# Patient Record
Sex: Male | Born: 1995 | Race: White | Hispanic: No | Marital: Single | Smoking: Current some day smoker
Health system: Southern US, Community
[De-identification: ages and names within clinical notes are randomized; demographics above are authoritative.]

## PROBLEM LIST (undated history)

## (undated) DIAGNOSIS — F988 Other specified behavioral and emotional disorders with onset usually occurring in childhood and adolescence: Secondary | ICD-10-CM

## (undated) HISTORY — PX: DENTAL SURGERY: SHX609

---
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
## 2016-12-01 IMAGING — CR DG WRIST COMPLETE 3+V*L*
3 series · 3 of 3 positions shown · non-contrast
Comparison: Left wrist radiographs performed earlier today at [DATE]
p.m.

CLINICAL DATA: Status post reduction of left wrist fracture. Status
post skateboarding accident. Initial encounter.

EXAM:
LEFT WRIST - COMPLETE 3+ VIEW

[x wrist pa left]
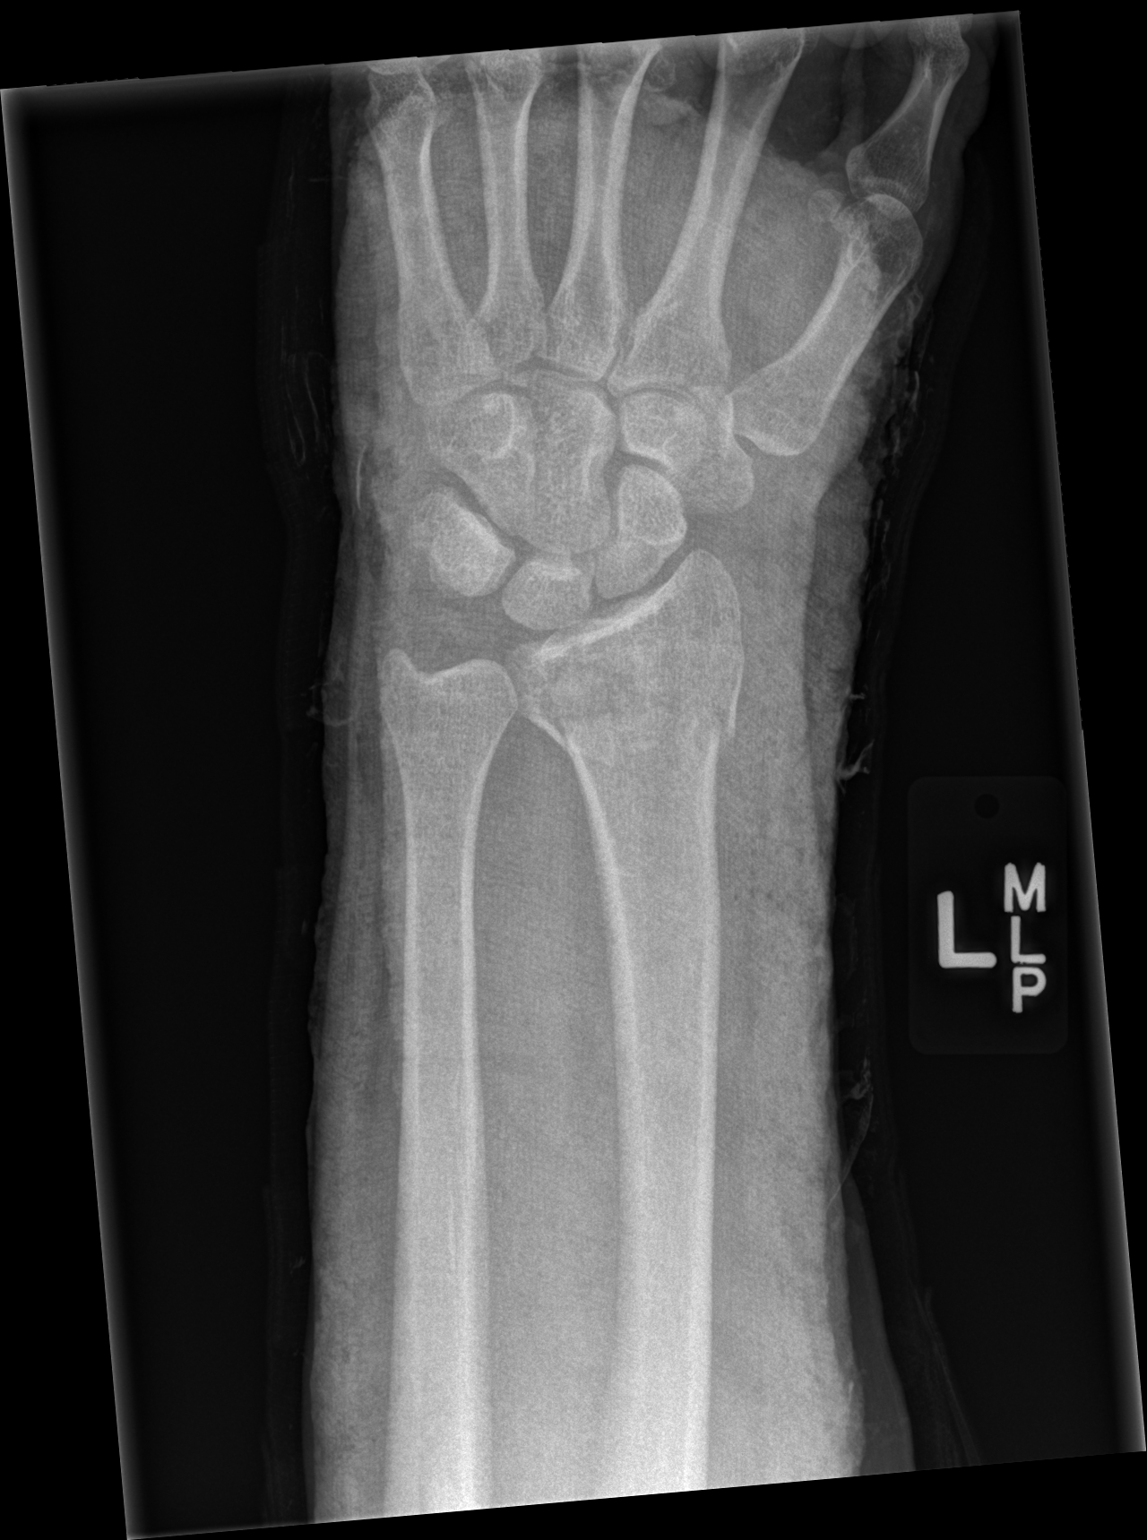

[x wrist obl left]
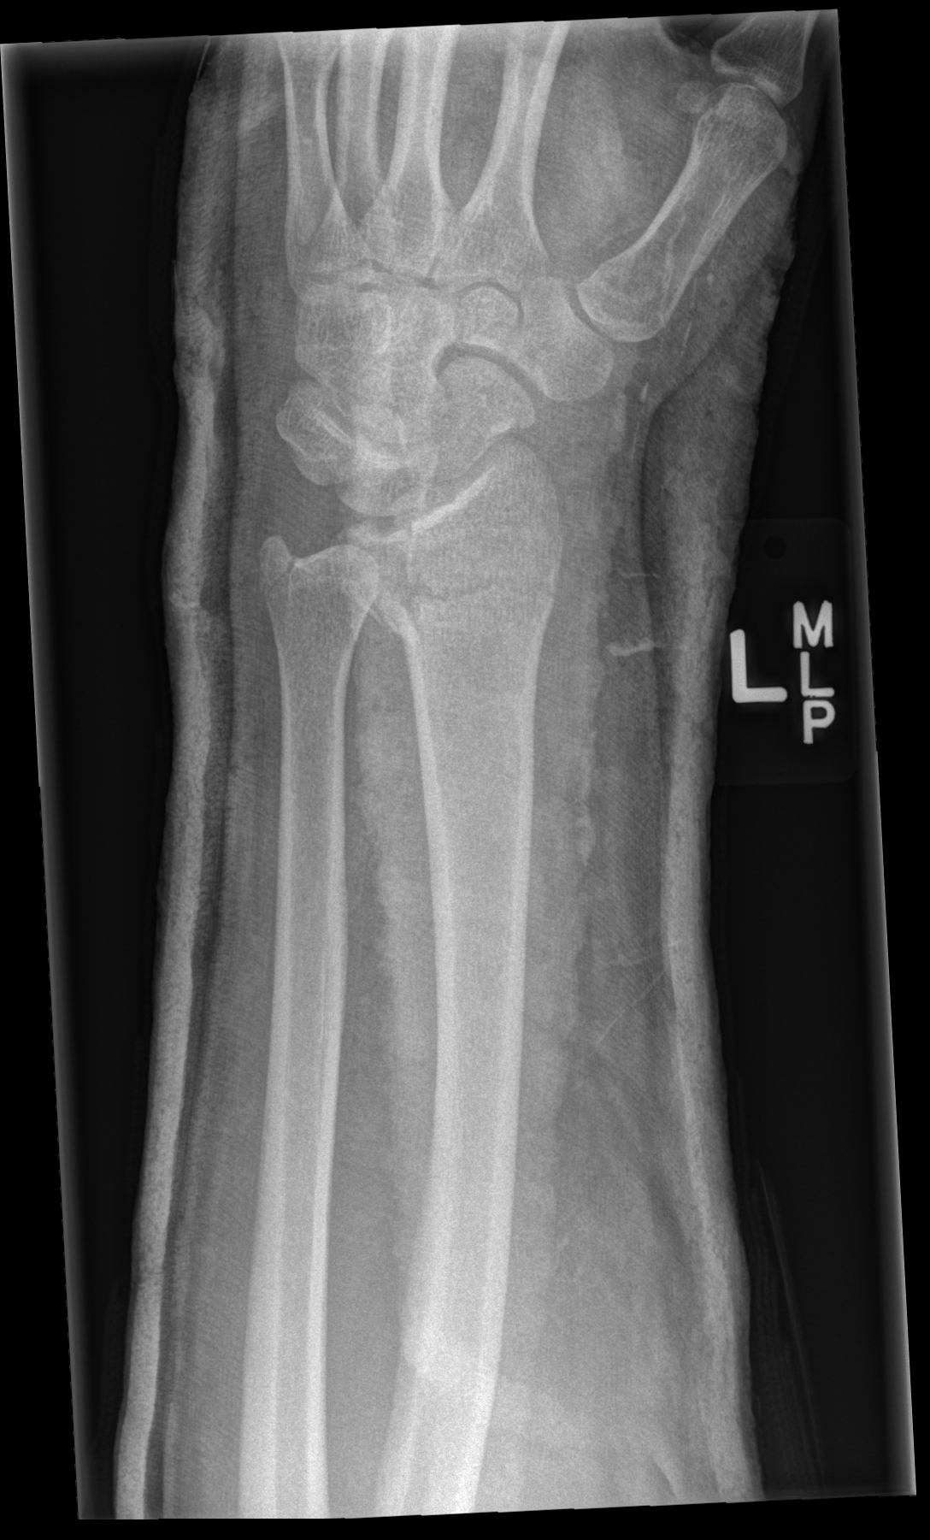

[x wrist lat left]
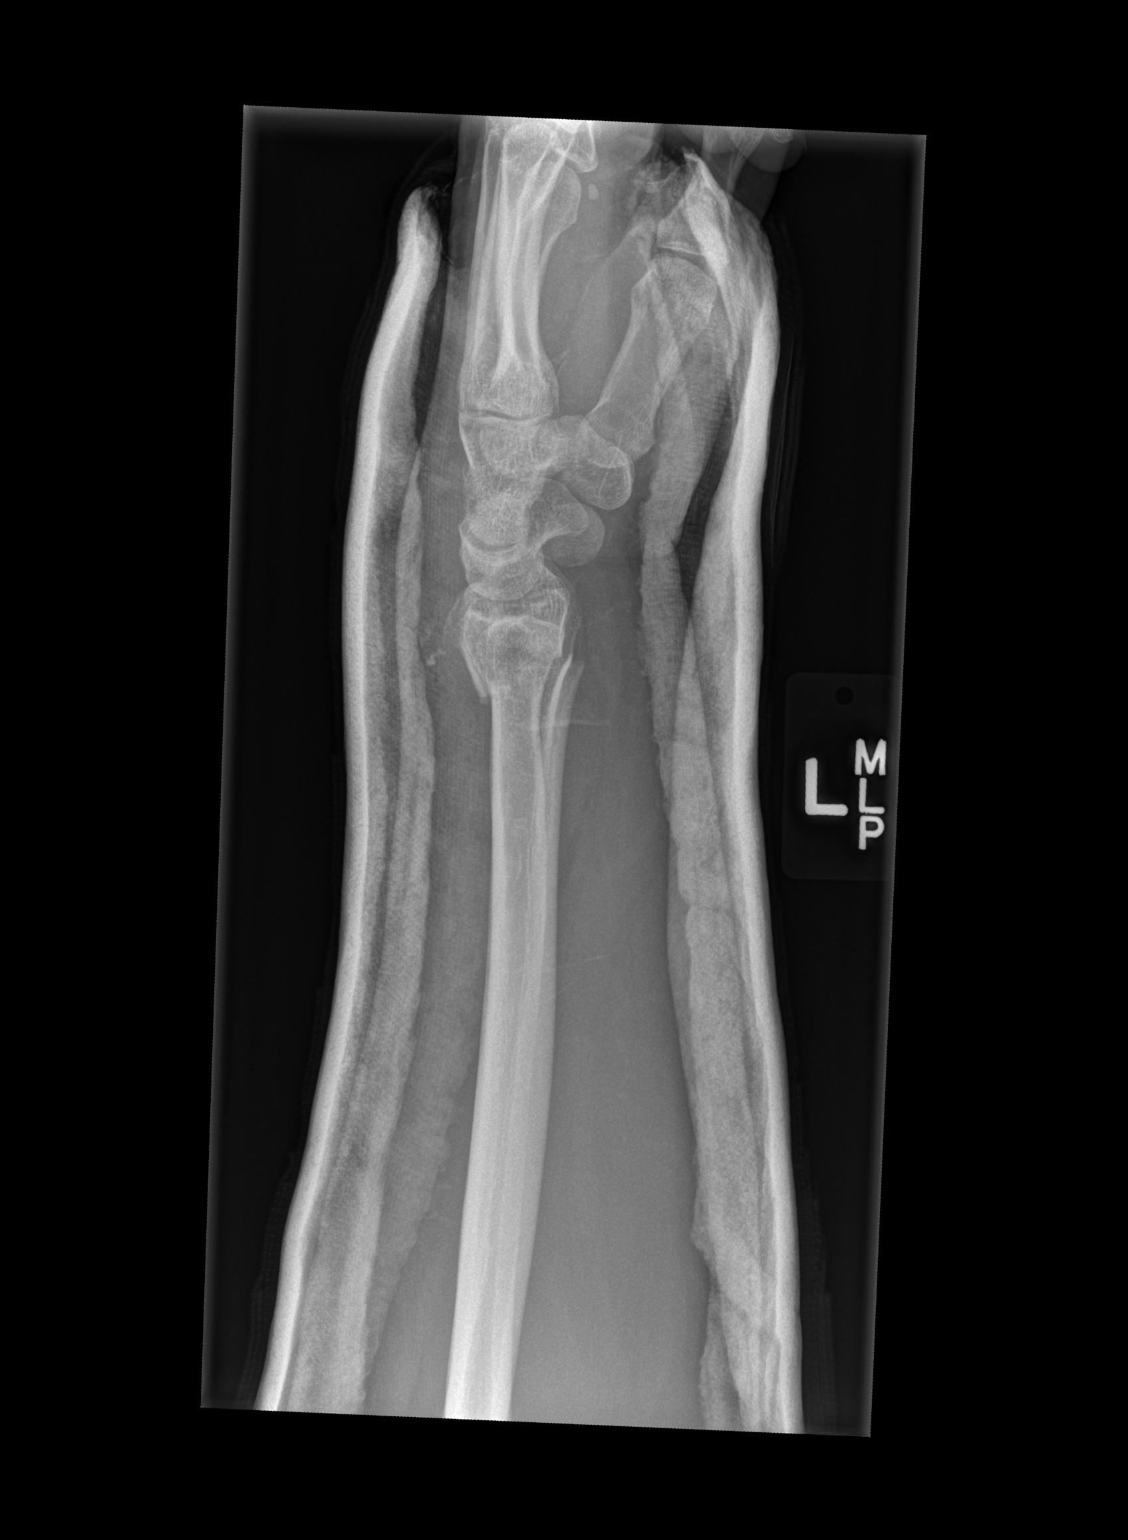

[3 of 3 positions shown; findings below may reference images not displayed]

FINDINGS: There is improved alignment of the comminuted fracture through the
distal radius, with mild residual dorsal displacement of distal
radius fragments, and slight dorsal tilt. Intra-articular extension
is again noted. A minimally displaced ulnar styloid fracture is
again seen.

The carpal rows appear grossly intact, and demonstrate normal
alignment. Soft tissue swelling is noted about the wrist. Evaluation
is mildly suboptimal due to the overlying splint.
IMPRESSION: Improved alignment of comminuted fracture through the distal radius,
with mild residual dorsal displacement of the distal radius
fragments, and slight dorsal tilt. Intra-articular extension again
noted. Minimally displaced ulnar styloid fracture seen.

## 2016-12-01 IMAGING — CR DG WRIST COMPLETE 3+V*L*
4 series · 4 of 4 positions shown · non-contrast
Comparison: None.

CLINICAL DATA: Wrist pain and deformity secondary to a
skateboarding accident tonight.

EXAM:
LEFT WRIST - COMPLETE 3+ VIEW

[x wrist pa left]
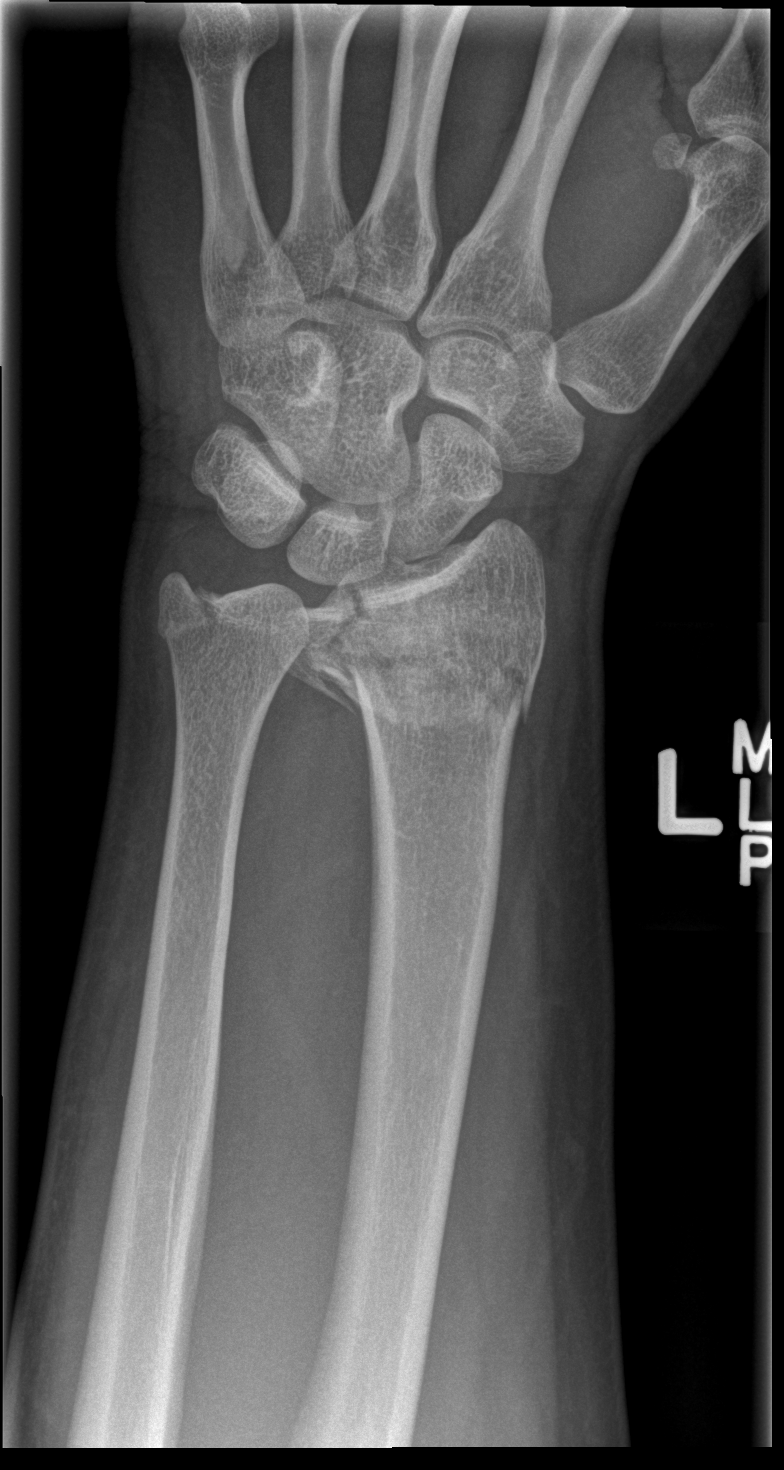

[x wrist navicular view left]
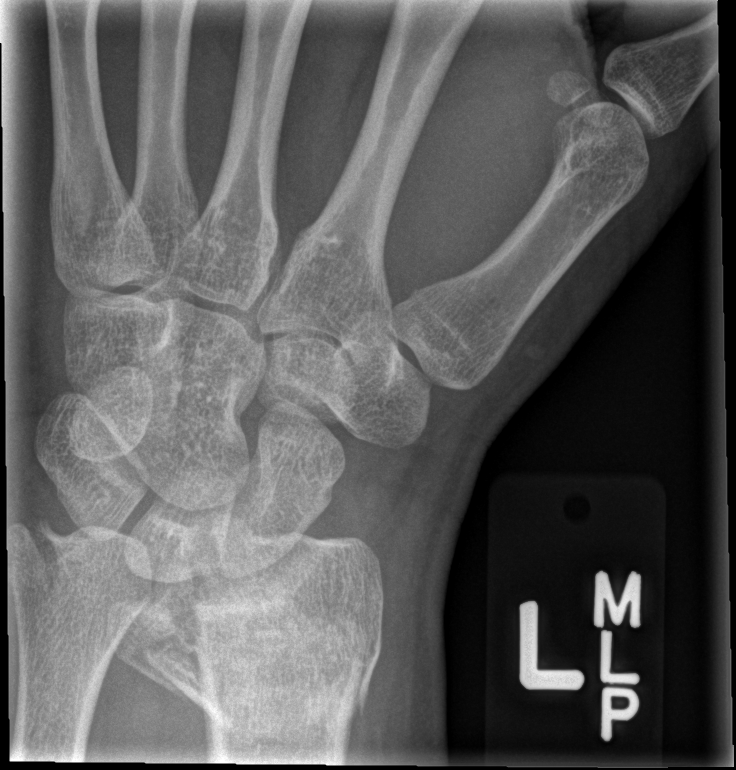

[x wrist obl left]
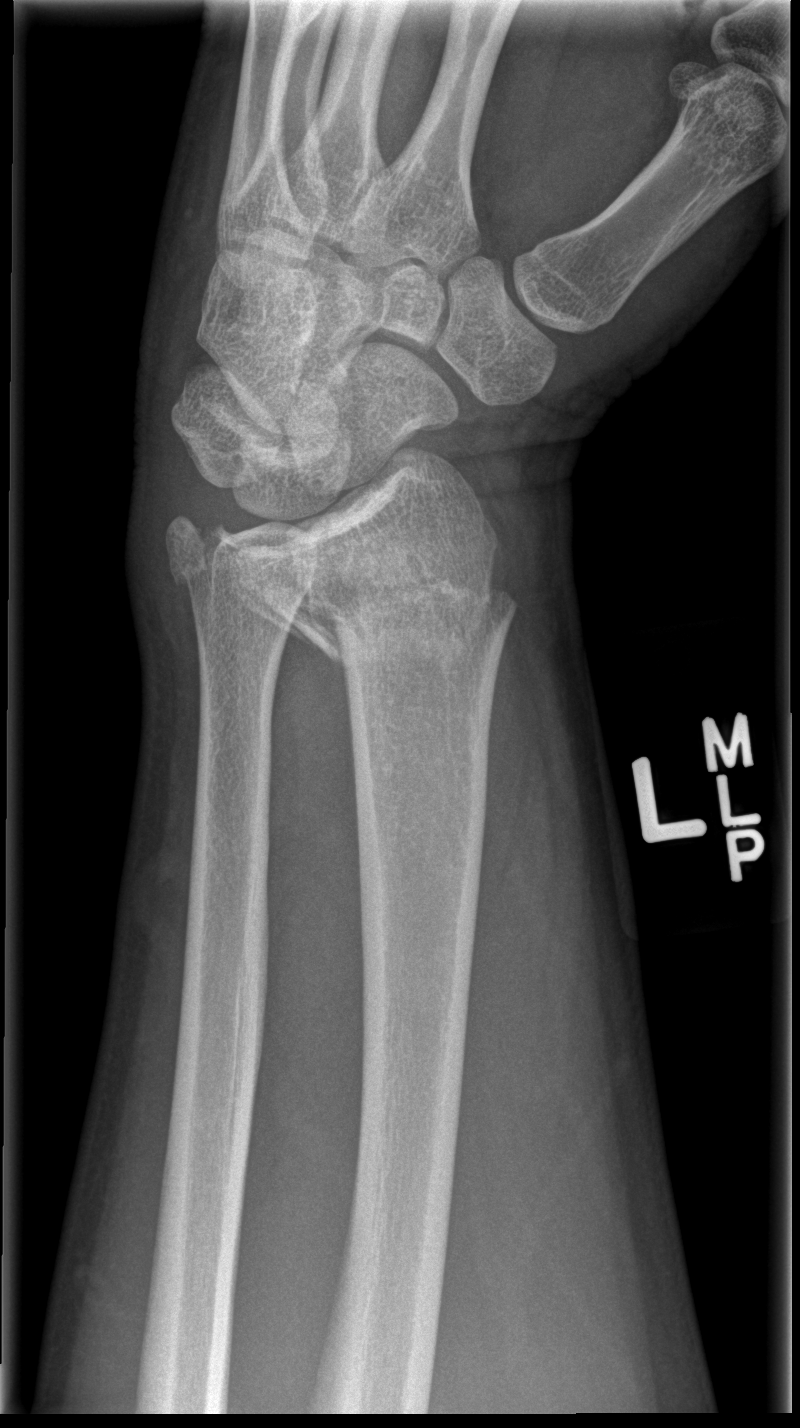

[x wrist lat left]
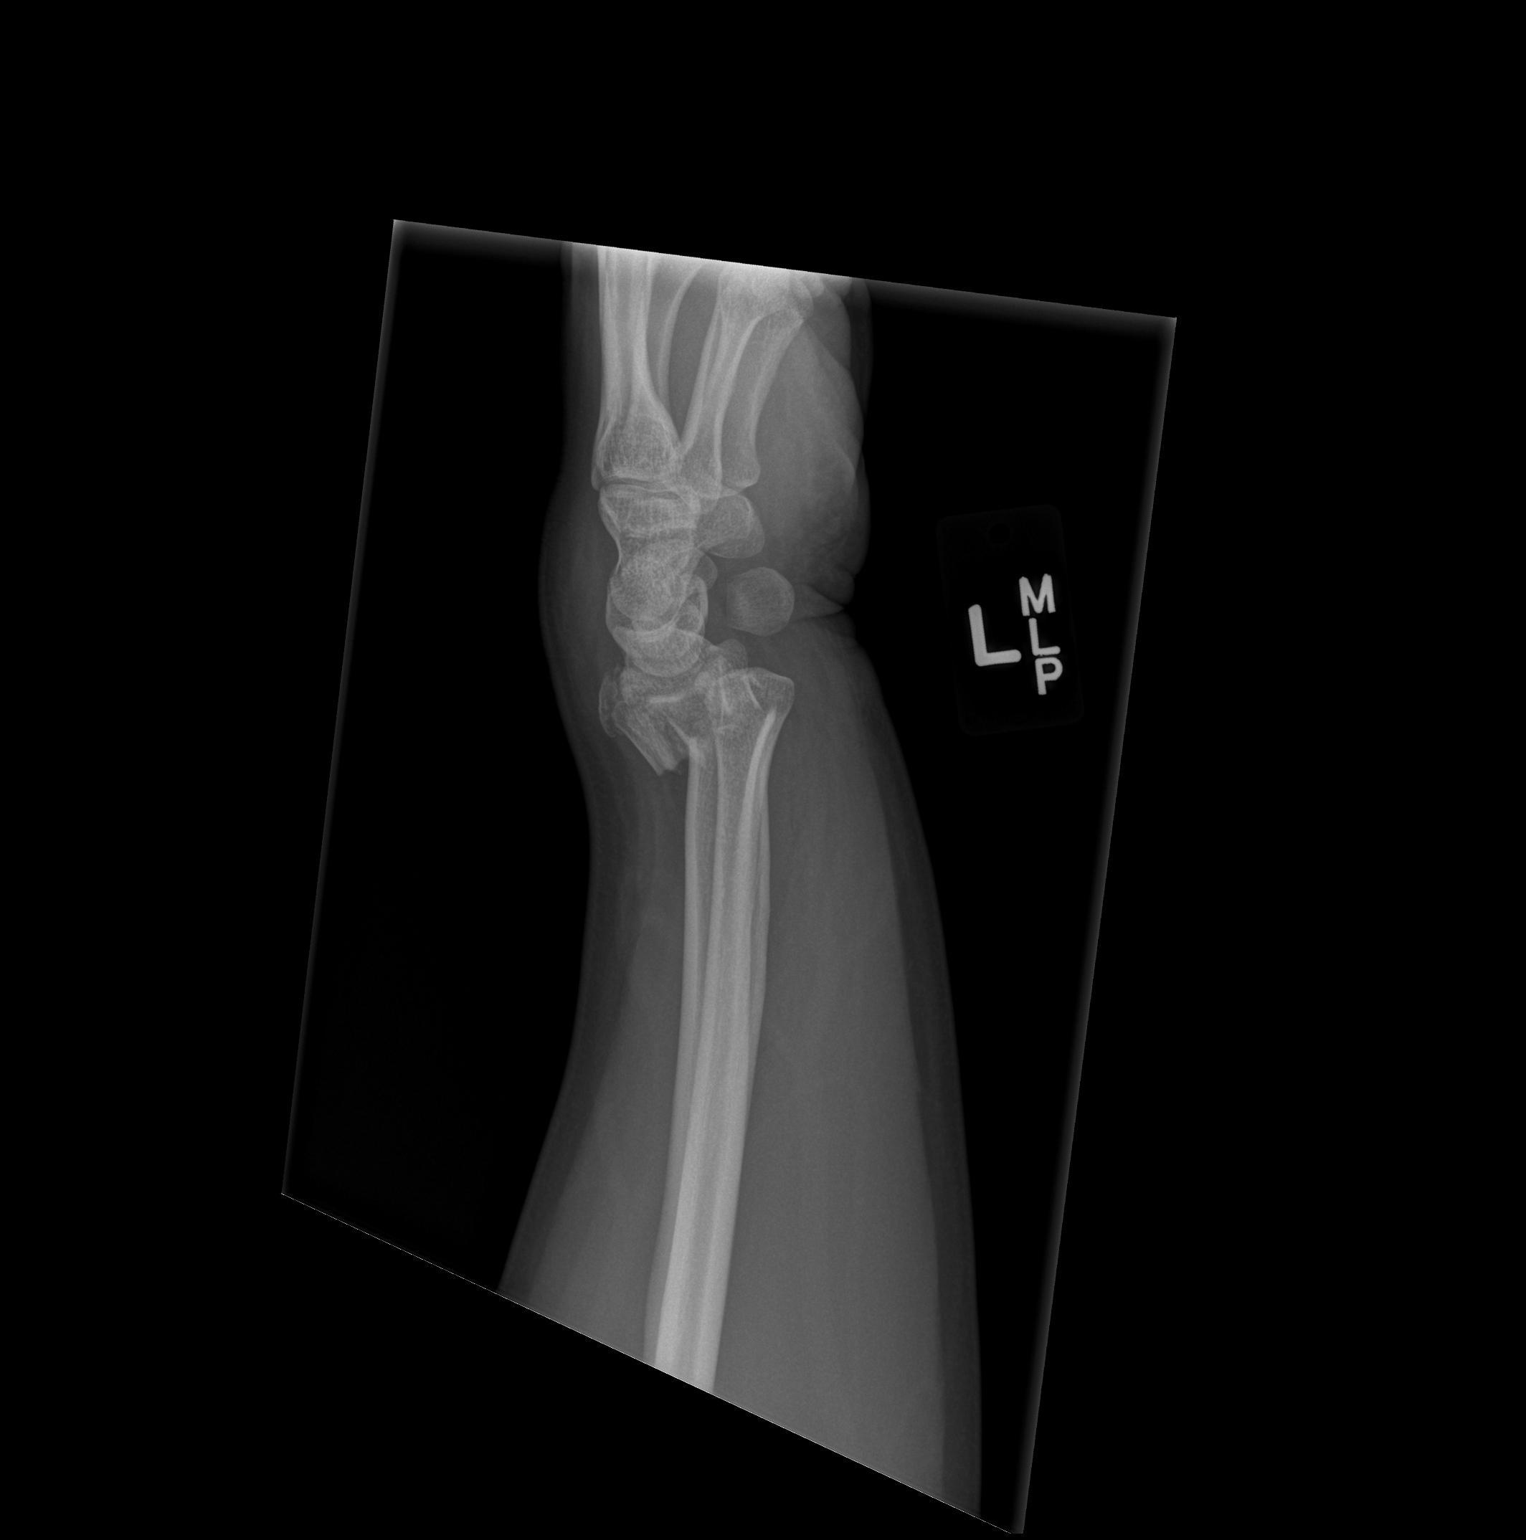

[4 of 4 positions shown; findings below may reference images not displayed]

FINDINGS: There is a comminuted dorsally impacted and dorsally displaced
fracture of the distal left radius. The fracture does involve the
articular surface. There is also a displaced fracture of the base of
the ulnar styloid.
IMPRESSION: Dorsally angulated and displaced fractures of the distal radius and
ulna as described.
